# Patient Record
Sex: Male | Born: 1980 | Race: Black or African American | Hispanic: No | Marital: Single | Smoking: Current every day smoker
Health system: Southern US, Community
[De-identification: ages and names within clinical notes are randomized; demographics above are authoritative.]

## PROBLEM LIST (undated history)

## (undated) ENCOUNTER — Ambulatory Visit (HOSPITAL_COMMUNITY): Admission: EM | Payer: 59 | Source: Home / Self Care

## (undated) DIAGNOSIS — J45909 Unspecified asthma, uncomplicated: Secondary | ICD-10-CM

---
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
## 2015-06-19 ENCOUNTER — Emergency Department (HOSPITAL_COMMUNITY)
Admission: EM | Admit: 2015-06-19 | Discharge: 2015-06-19 | Disposition: A | Discharge status: Home / Self Care | Payer: Self-pay | Attending: Emergency Medicine | Admitting: Emergency Medicine

## 2015-06-19 ENCOUNTER — Encounter (HOSPITAL_COMMUNITY): Payer: Self-pay | Admitting: Emergency Medicine

## 2015-06-19 DIAGNOSIS — K029 Dental caries, unspecified: Secondary | ICD-10-CM | POA: Insufficient documentation

## 2015-06-19 DIAGNOSIS — Z720 Tobacco use: Secondary | ICD-10-CM | POA: Insufficient documentation

## 2015-06-19 DIAGNOSIS — K047 Periapical abscess without sinus: Secondary | ICD-10-CM | POA: Insufficient documentation

## 2015-06-19 MED ORDER — TRAMADOL HCL 50 MG PO TABS
50.0000 mg | ORAL_TABLET | Freq: Once | ORAL | Status: AC
  Administered 2015-06-19: 50 mg via ORAL
  Filled 2015-06-19: qty 1

## 2015-06-19 MED ORDER — TRAMADOL HCL 50 MG PO TABS: 50.0000 mg | ORAL_TABLET | Freq: Four times a day (QID) | ORAL | Status: DC | PRN

## 2015-06-19 MED ORDER — AMOXICILLIN 500 MG PO CAPS: 500.0000 mg | ORAL_CAPSULE | Freq: Three times a day (TID) | ORAL | Status: AC

## 2015-06-19 NOTE — ED Notes (Signed)
PT c/o left lower dental pain x2 weeks.

## 2015-06-19 NOTE — Discharge Instructions (Signed)
Dental Abscess °A dental abscess is a collection of infected fluid (pus) from a bacterial infection in the inner part of the tooth (pulp). It usually occurs at the end of the tooth's root.  °CAUSES  °· Severe tooth decay. °· Trauma to the tooth that allows bacteria to enter into the pulp, such as a broken or chipped tooth. °SYMPTOMS  °· Severe pain in and around the infected tooth. °· Swelling and redness around the abscessed tooth or in the mouth or face. °· Tenderness. °· Pus drainage. °· Bad breath. °· Bitter taste in the mouth. °· Difficulty swallowing. °· Difficulty opening the mouth. °· Nausea. °· Vomiting. °· Chills. °· Swollen neck glands. °DIAGNOSIS  °· A medical and dental history will be taken. °· An examination will be performed by tapping on the abscessed tooth. °· X-rays may be taken of the tooth to identify the abscess. °TREATMENT °The goal of treatment is to eliminate the infection. You may be prescribed antibiotic medicine to stop the infection from spreading. A root canal may be performed to save the tooth. If the tooth cannot be saved, it may be pulled (extracted) and the abscess may be drained.  °HOME CARE INSTRUCTIONS °· Only take over-the-counter or prescription medicines for pain, fever, or discomfort as directed by your caregiver. °· Rinse your mouth (gargle) often with salt water (¼ tsp salt in 8 oz [250 ml] of warm water) to relieve pain or swelling. °· Do not drive after taking pain medicine (narcotics). °· Do not apply heat to the outside of your face. °· Return to your dentist for further treatment as directed. °SEEK MEDICAL CARE IF: °· Your pain is not helped by medicine. °· Your pain is getting worse instead of better. °SEEK IMMEDIATE MEDICAL CARE IF: °· You have a fever or persistent symptoms for more than 2-3 days. °· You have a fever and your symptoms suddenly get worse. °· You have chills or a very bad headache. °· You have problems breathing or swallowing. °· You have trouble  opening your mouth. °· You have swelling in the neck or around the eye. °Document Released: 11/08/2005 Document Revised: 08/02/2012 Document Reviewed: 02/16/2011 °ExitCare® Patient Information ©2015 [employer]. This information is not intended to replace advice given to you by your health care provider. Make sure you discuss any questions you have with your health care provider. ° ° °Complete your entire course of antibiotics as prescribed.  You  may use the tramadol for pain relief but do not drive within 4 hours of taking as this will make you drowsy.  Avoid applying heat or ice to this abscess area which can worsen your symptoms.  You may use warm salt water swish and spit treatment or half peroxide and water swish and spit after meals to keep this area clean as discussed.  Call the dentist listed above for further management of your symptoms. ° ° °

## 2015-06-20 LAB — CBG MONITORING, ED: Glucose-Capillary: 238 mg/dL — ABNORMAL HIGH (ref 65–99)

## 2015-06-20 NOTE — ED Provider Notes (Signed)
CSN: [id number]     Arrival date & time 06/19/15  1403 History   First MD Initiated Contact with Patient 06/19/15 1426     Chief Complaint  Patient presents with  . Dental Pain     (Consider location/radiation/quality/duration/timing/severity/associated sxs/prior Treatment) Patient is a 34 y.o. male presenting with tooth pain. The history is provided by the patient.  Dental Pain Location:  Lower Lower teeth location:  17/LL 3rd molar and 18/LL 2nd molar Quality:  Shooting and throbbing Severity:  Moderate Onset quality:  Gradual Duration:  2 weeks Timing:  Constant Progression:  Worsening Chronicity:  Recurrent Context: dental caries and poor dentition   Prior workup: none. Relieved by:  Nothing Worsened by:  Cold food/drink Ineffective treatments:  Acetaminophen (bc's ) Associated symptoms: facial pain and gum swelling   Associated symptoms: no congestion, no difficulty swallowing, no facial swelling, no fever, no headaches, no neck pain, no neck swelling, no oral bleeding, no oral lesions and no trismus   Risk factors: lack of dental care and smoking     History reviewed. No pertinent past medical history. History reviewed. No pertinent past surgical history. History reviewed. No pertinent family history. History  Substance Use Topics  . Smoking status: Current Every Day Smoker -- 1.50 packs/day    Types: Cigarettes  . Smokeless tobacco: Not on file  . Alcohol Use: Yes     Comment: occassionally    Review of Systems  Constitutional: Negative for fever.  HENT: Positive for dental problem. Negative for congestion, facial swelling and mouth sores.   Musculoskeletal: Negative for neck pain.  Neurological: Negative for headaches.      Allergies  Review of patient's allergies indicates no known allergies.  Home Medications   Prior to Admission medications   Medication Sig Start Date End Date Taking? Authorizing Provider  amoxicillin (AMOXIL) 500 MG capsule Take  1 capsule (500 mg total) by mouth 3 (three) times daily. 06/19/15 06/29/15  Burgess Amor, PA-C  traMADol (ULTRAM) 50 MG tablet Take 1 tablet (50 mg total) by mouth every 6 (six) hours as needed. 06/19/15   Burgess Amor, PA-C   BP 150/88 mmHg  Pulse 78  Temp(Src) 97.6 F (36.4 C) (Oral)  Resp 18  Ht 6' (1.829 m)  Wt 175 lb (79.379 kg)  BMI 23.73 kg/m2  SpO2 100% Physical Exam  Constitutional: He is oriented to person, place, and time. He appears well-developed and well-nourished. No distress.  HENT:  Head: Normocephalic and atraumatic.  Right Ear: Tympanic membrane and external ear normal.  Left Ear: Tympanic membrane and external ear normal.  Mouth/Throat: Oropharynx is clear and moist and mucous membranes are normal. No oral lesions. No trismus in the jaw. Dental abscesses present.    Cavity and decay of left lower 2nd and 3rd molars.  Gingival edema, induration without fluctuance, no drainage, no facial erythema.  Subungual space is soft.  Eyes: Conjunctivae are normal.  Neck: Normal range of motion. Neck supple.  Cardiovascular: Normal rate and normal heart sounds.   Pulmonary/Chest: Effort normal.  Abdominal: He exhibits no distension.  Musculoskeletal: Normal range of motion.  Lymphadenopathy:    He has no cervical adenopathy.  Neurological: He is alert and oriented to person, place, and time.  Skin: Skin is warm and dry. No erythema.  Psychiatric: He has a normal mood and affect.    ED Course  Procedures (including critical care time) Labs Review Labs Reviewed - No data to display  Imaging Review No results  found.   EKG Interpretation None      MDM   Final diagnoses:  Dental abscess    Pt placed on amoxil, tramadol.  Dental referrals given. Suggested dental putty as temporary pain reliever.  Prn f/u. No trismus, no facial cellulitis or abscess, no drainable abscess noted.    Burgess Amor, PA-C 06/20/15 [id number]  Rolland Porter, MD 06/21/15 1005

## 2015-06-24 ENCOUNTER — Encounter (HOSPITAL_COMMUNITY): Payer: Self-pay

## 2015-06-24 ENCOUNTER — Emergency Department (HOSPITAL_COMMUNITY)
Admission: EM | Admit: 2015-06-24 | Discharge: 2015-06-24 | Disposition: A | Discharge status: Home / Self Care | Payer: Self-pay | Attending: Emergency Medicine | Admitting: Emergency Medicine

## 2015-06-24 DIAGNOSIS — K047 Periapical abscess without sinus: Secondary | ICD-10-CM | POA: Insufficient documentation

## 2015-06-24 DIAGNOSIS — Z720 Tobacco use: Secondary | ICD-10-CM | POA: Insufficient documentation

## 2015-06-24 DIAGNOSIS — K0381 Cracked tooth: Secondary | ICD-10-CM | POA: Insufficient documentation

## 2015-06-24 MED ORDER — HYDROCODONE-ACETAMINOPHEN 5-325 MG PO TABS: 1.0000 | ORAL_TABLET | ORAL | Status: DC | PRN

## 2015-06-24 MED ORDER — CLINDAMYCIN HCL 150 MG PO CAPS
300.0000 mg | ORAL_CAPSULE | Freq: Once | ORAL | Status: AC
  Administered 2015-06-24: 300 mg via ORAL
  Filled 2015-06-24: qty 2

## 2015-06-24 MED ORDER — OXYCODONE-ACETAMINOPHEN 5-325 MG PO TABS
1.0000 | ORAL_TABLET | Freq: Once | ORAL | Status: AC
  Administered 2015-06-24: 1 via ORAL
  Filled 2015-06-24: qty 1

## 2015-06-24 MED ORDER — CLINDAMYCIN HCL 150 MG PO CAPS: 300.0000 mg | ORAL_CAPSULE | Freq: Three times a day (TID) | ORAL | Status: DC

## 2015-06-24 NOTE — Discharge Instructions (Signed)
Continue to take the ibuprofen. Stop the tramadol and take the hydrocodone. Stop the [facility] powder. Follow up with the dentist as soon as possible.

## 2015-06-24 NOTE — ED Provider Notes (Signed)
CSN: [id number]     Arrival date & time 06/24/15  2044 History   First MD Initiated Contact with Patient 06/24/15 2058     Chief Complaint  Patient presents with  . Dental Pain     (Consider location/radiation/quality/duration/timing/severity/associated sxs/prior Treatment) Patient is a 34 y.o. male presenting with tooth pain. The history is provided by the patient.  Dental Pain Location:  Lower Lower teeth location:  18/LL 2nd molar and 19/LL 1st molar Quality:  Throbbing Severity:  Severe Onset quality:  Gradual Duration:  2 weeks Timing:  Constant Progression:  Worsening Context: abscess   Relieved by:  Nothing Worsened by:  Cold food/drink  Randy Silva is a 33 y.o. male who presents to the ED with dental pain. He was evaluated here 7/28 at that time he thought the pain was mostly in the second and third molars but now it feels more like first and second. There is a crack in the first molar on the left. He was treated with antibiotics, NSAIDS and Tramadol. He states that he has been taking the medication but the pain is the same. He has been brushing his teeth with a soft brush and using salt water rinses, peroxide and any OTC pain reliever he can find. He states that he can not see the dentist for 2 weeks. The pain is now radiating to the left ear. He has BC powder on the teeth.    History reviewed. No pertinent past medical history. History reviewed. No pertinent past surgical history. History reviewed. No pertinent family history. History  Substance Use Topics  . Smoking status: Current Every Day Smoker -- 1.50 packs/day    Types: Cigarettes  . Smokeless tobacco: Not on file  . Alcohol Use: Yes     Comment: occassionally    Review of Systems Negative except as stated in HPI   Allergies  Review of patient's allergies indicates no known allergies.  Home Medications   Prior to Admission medications   Medication Sig Start Date End Date Taking? Authorizing Provider   amoxicillin (AMOXIL) 500 MG capsule Take 1 capsule (500 mg total) by mouth 3 (three) times daily. 06/19/15 06/29/15  Burgess Amor, PA-C  clindamycin (CLEOCIN) 150 MG capsule Take 2 capsules (300 mg total) by mouth 3 (three) times daily. 06/24/15   Rhesa Forsberg Orlene Och, NP  HYDROcodone-acetaminophen (NORCO/VICODIN) 5-325 MG per tablet Take 1 tablet by mouth every 4 (four) hours as needed. 06/24/15   Jodey Burbano Orlene Och, NP   BP 157/94 mmHg  Pulse 60  Temp(Src) 98.2 F (36.8 C) (Oral)  Resp 16  Ht 6' (1.829 m)  Wt 175 lb (79.379 kg)  BMI 23.73 kg/m2  SpO2 100% Physical Exam  Constitutional: He is oriented to person, place, and time. He appears well-developed and well-nourished. No distress.  Patient appears very uncomfortable  HENT:  Head: Normocephalic.  Mouth/Throat: Uvula is midline and oropharynx is clear and moist.    Decay of left lower first and second molars. Swelling of the gum surrounding the teeth and tenderness on exam. There is a crack in the first molar.  No facial edema or erythema.  There is BC powder on the left lower molars.  Eyes: EOM are normal.  Neck: Normal range of motion. Neck supple.  Cardiovascular: Normal rate.   Pulmonary/Chest: Effort normal.  Musculoskeletal: Normal range of motion.  Lymphadenopathy:    He has no cervical adenopathy.  Neurological: He is alert and oriented to person, place, and time. No cranial  nerve deficit.  Skin: Skin is warm and dry.  Psychiatric: He has a normal mood and affect. His behavior is normal.  Nursing note and vitals reviewed.   ED Course  Procedures (including critical care time) Labs Review Labs Reviewed - No data to display  Imaging Review No results found.   EKG Interpretation None      MDM  34 y.o. male with dental pain, dental infection and broken tooth. Stable for d/c without fever and does not appear toxic. Will give hydrocodone for pain and he will continue ibuprofen.  He will follow up with the dentist as planned.    Final diagnoses:  Dental infection       Janne Napoleon, NP 06/24/15 2125  Eber Hong, MD 06/25/15 [phone]

## 2015-06-24 NOTE — ED Notes (Signed)
Pt seen here recently for dental pain given NSAIDs and antibiotic states no pain relief. States he has an abscess.

## 2015-07-11 ENCOUNTER — Emergency Department (HOSPITAL_COMMUNITY)
Admission: EM | Admit: 2015-07-11 | Discharge: 2015-07-11 | Disposition: A | Discharge status: Home / Self Care | Payer: Self-pay | Attending: Emergency Medicine | Admitting: Emergency Medicine

## 2015-07-11 ENCOUNTER — Encounter (HOSPITAL_COMMUNITY): Payer: Self-pay | Admitting: Emergency Medicine

## 2015-07-11 DIAGNOSIS — K0889 Other specified disorders of teeth and supporting structures: Secondary | ICD-10-CM

## 2015-07-11 DIAGNOSIS — Z720 Tobacco use: Secondary | ICD-10-CM | POA: Insufficient documentation

## 2015-07-11 DIAGNOSIS — K029 Dental caries, unspecified: Secondary | ICD-10-CM | POA: Insufficient documentation

## 2015-07-11 DIAGNOSIS — K088 Other specified disorders of teeth and supporting structures: Secondary | ICD-10-CM | POA: Insufficient documentation

## 2015-07-11 DIAGNOSIS — K0381 Cracked tooth: Secondary | ICD-10-CM | POA: Insufficient documentation

## 2015-07-11 MED ORDER — HYDROCODONE-ACETAMINOPHEN 5-325 MG PO TABS: 1.0000 | ORAL_TABLET | ORAL | Status: DC | PRN

## 2015-07-11 MED ORDER — AMOXICILLIN 500 MG PO CAPS: 500.0000 mg | ORAL_CAPSULE | Freq: Three times a day (TID) | ORAL | Status: AC

## 2015-07-11 NOTE — Discharge Instructions (Signed)
Dental Caries °Dental caries (also called tooth decay) is the most common oral disease. It can occur at any age but is more common in children and young adults.  °HOW DENTAL CARIES DEVELOPS  °The process of decay begins when bacteria and foods (particularly sugars and starches) combine in your mouth to produce plaque. Plaque is a substance that sticks to the hard, outer surface of a tooth (enamel). The bacteria in plaque produce acids that attack enamel. These acids may also attack the root surface of a tooth (cementum) if it is exposed. Repeated attacks dissolve these surfaces and create holes in the tooth (cavities). If left untreated, the acids destroy the other layers of the tooth.  °RISK FACTORS °· Frequent sipping of sugary beverages.   °· Frequent snacking on sugary and starchy foods, especially those that easily get stuck in the teeth.   °· Poor oral hygiene.   °· Dry mouth.   °· Substance abuse such as methamphetamine abuse.   °· Broken or poor-fitting dental restorations.   °· Eating disorders.   °· Gastroesophageal reflux disease (GERD).   °· Certain radiation treatments to the head and neck. °SYMPTOMS °In the early stages of dental caries, symptoms are seldom present. Sometimes white, chalky areas may be seen on the enamel or other tooth layers. In later stages, symptoms may include: °· Pits and holes on the enamel. °· Toothache after sweet, hot, or cold foods or drinks are consumed. °· Pain around the tooth. °· Swelling around the tooth. °DIAGNOSIS  °Most of the time, dental caries is detected during a regular dental checkup. A diagnosis is made after a thorough medical and dental history is taken and the surfaces of your teeth are checked for signs of dental caries. Sometimes special instruments, such as lasers, are used to check for dental caries. Dental X-ray exams may be taken so that areas not visible to the eye (such as between the contact areas of the teeth) can be checked for cavities.    °TREATMENT  °If dental caries is in its early stages, it may be reversed with a fluoride treatment or an application of a remineralizing agent at the dental office. Thorough brushing and flossing at home is needed to aid these treatments. If it is in its later stages, treatment depends on the location and extent of tooth destruction:  °· If a small area of the tooth has been destroyed, the destroyed area will be removed and cavities will be filled with a material such as gold, silver amalgam, or composite resin.   °· If a large area of the tooth has been destroyed, the destroyed area will be removed and a cap (crown) will be fitted over the remaining tooth structure.   °· If the center part of the tooth (pulp) is affected, a procedure called a root canal will be needed before a filling or crown can be placed.   °· If most of the tooth has been destroyed, the tooth may need to be pulled (extracted). °HOME CARE INSTRUCTIONS °You can prevent, stop, or reverse dental caries at home by practicing good oral hygiene. Good oral hygiene includes: °· Thoroughly cleaning your teeth at least twice a day with a toothbrush and dental floss.   °· Using a fluoride toothpaste. A fluoride mouth rinse may also be used if recommended by your dentist or health care provider.   °· Restricting the amount of sugary and starchy foods and sugary liquids you consume.   °· Avoiding frequent snacking on these foods and sipping of these liquids.   °· Keeping regular visits with   a dentist for checkups and cleanings. °PREVENTION  °· Practice good oral hygiene. °· Consider a dental sealant. A dental sealant is a coating material that is applied by your dentist to the pits and grooves of teeth. The sealant prevents food from being trapped in them. It may protect the teeth for several years. °· Ask about fluoride supplements if you live in a community without fluorinated water or with water that has a low fluoride content. Use fluoride supplements  as directed by your dentist or health care provider. °· Allow fluoride varnish applications to teeth if directed by your dentist or health care provider. °Document Released: 07/31/2002 Document Revised: 03/25/2014 Document Reviewed: 11/10/2012 °ExitCare® Patient Information ©2015 [employer]. This information is not intended to replace advice given to you by your health care provider. Make sure you discuss any questions you have with your health care provider. ° °Dental Pain °A tooth ache may be caused by cavities (tooth decay). Cavities expose the nerve of the tooth to air and hot or cold temperatures. It may come from an infection or abscess (also called a boil or furuncle) around your tooth. It is also often caused by dental caries (tooth decay). This causes the pain you are having. °DIAGNOSIS  °Your caregiver can diagnose this problem by exam. °TREATMENT  °· If caused by an infection, it may be treated with medications which kill germs (antibiotics) and pain medications as prescribed by your caregiver. Take medications as directed. °· Only take over-the-counter or prescription medicines for pain, discomfort, or fever as directed by your caregiver. °· Whether the tooth ache today is caused by infection or dental disease, you should see your dentist as soon as possible for further care. °SEEK MEDICAL CARE IF: °The exam and treatment you received today has been provided on an emergency basis only. This is not a substitute for complete medical or dental care. If your problem worsens or new problems (symptoms) appear, and you are unable to meet with your dentist, call or return to this location. °SEEK IMMEDIATE MEDICAL CARE IF:  °· You have a fever. °· You develop redness and swelling of your face, jaw, or neck. °· You are unable to open your mouth. °· You have severe pain uncontrolled by pain medicine. °MAKE SURE YOU:  °· Understand these instructions. °· Will watch your condition. °· Will get help right away if  you are not doing well or get worse. °Document Released: 11/08/2005 Document Revised: 01/31/2012 Document Reviewed: 06/26/2008 °ExitCare® Patient Information ©2015 [employer]. This information is not intended to replace advice given to you by your health care provider. Make sure you discuss any questions you have with your health care provider. ° °

## 2015-07-11 NOTE — ED Notes (Signed)
Patient complaining of lower left dental pain x 1 1/2 months. States he has been unable to follow up with dentist due to financial reasons.

## 2015-07-12 NOTE — ED Provider Notes (Signed)
CSN: [id number]     Arrival date & time 07/11/15  1612 History   First MD Initiated Contact with Patient 07/11/15 1634     Chief Complaint  Patient presents with  . Dental Pain     (Consider location/radiation/quality/duration/timing/severity/associated sxs/prior Treatment) Patient is a 34 y.o. male presenting with tooth pain. The history is provided by the patient.  Dental Pain Location:  Lower Lower teeth location:  17/LL 3rd molar, 18/LL 2nd molar and 19/LL 1st molar Quality:  Aching and throbbing Severity:  Severe Onset quality:  Gradual Timing:  Intermittent Progression:  Worsening Chronicity:  New Context: dental caries and poor dentition   Context comment:  Also reports new fracture in first left lower molar Relieved by:  Nothing Worsened by:  Cold food/drink and pressure Ineffective treatments:  Topical anesthetic gel (bc powders) Associated symptoms: facial pain and gum swelling   Associated symptoms: no difficulty swallowing, no facial swelling, no fever, no neck pain, no neck swelling and no trismus     History reviewed. No pertinent past medical history. History reviewed. No pertinent past surgical history. History reviewed. No pertinent family history. Social History  Substance Use Topics  . Smoking status: Current Every Day Smoker -- 1.50 packs/day    Types: Cigarettes  . Smokeless tobacco: None  . Alcohol Use: Yes     Comment: occassionally    Review of Systems  Constitutional: Negative for fever.  HENT: Positive for dental problem. Negative for facial swelling and sore throat.   Respiratory: Negative for shortness of breath.   Musculoskeletal: Negative for neck pain and neck stiffness.      Allergies  Review of patient's allergies indicates no known allergies.  Home Medications   Prior to Admission medications   Medication Sig Start Date End Date Taking? Authorizing Provider  acetaminophen (TYLENOL) 500 MG tablet Take 500 mg by mouth every 6  (six) hours as needed for mild pain or moderate pain.   Yes Historical Provider, MD  ibuprofen (ADVIL,MOTRIN) 200 MG tablet Take 200 mg by mouth every 6 (six) hours as needed for mild pain or moderate pain.   Yes Historical Provider, MD  amoxicillin (AMOXIL) 500 MG capsule Take 1 capsule (500 mg total) by mouth 3 (three) times daily. 07/11/15 07/21/15  Burgess Amor, PA-C  clindamycin (CLEOCIN) 150 MG capsule Take 2 capsules (300 mg total) by mouth 3 (three) times daily. Patient not taking: Reported on 07/11/2015 06/24/15   Janne Napoleon, NP  HYDROcodone-acetaminophen (NORCO/VICODIN) 5-325 MG per tablet Take 1 tablet by mouth every 4 (four) hours as needed. 07/11/15   Burgess Amor, PA-C   BP 135/76 mmHg  Pulse 68  Temp(Src) 98 F (36.7 C) (Oral)  Resp 18  Ht 6' (1.829 m)  Wt 175 lb (79.379 kg)  BMI 23.73 kg/m2  SpO2 98% Physical Exam  Constitutional: He is oriented to person, place, and time. He appears well-developed and well-nourished. No distress.  HENT:  Head: Normocephalic and atraumatic.  Right Ear: Tympanic membrane and external ear normal.  Left Ear: Tympanic membrane and external ear normal.  Mouth/Throat: Oropharynx is clear and moist and mucous membranes are normal. No oral lesions. No trismus in the jaw.    Decay noted to left lower 2nd and 3rd molar teeth, 1st molar also with fracture/decay along posterior edge.  Gingival erythema without edema.  Eyes: Conjunctivae are normal.  Neck: Normal range of motion. Neck supple.  Cardiovascular: Normal rate and normal heart sounds.   Pulmonary/Chest: Effort normal.  Abdominal: He exhibits no distension.  Musculoskeletal: Normal range of motion.  Lymphadenopathy:    He has no cervical adenopathy.  Neurological: He is alert and oriented to person, place, and time.  Skin: Skin is warm and dry. No erythema.  Psychiatric: He has a normal mood and affect.    ED Course  Procedures (including critical care time) Labs Review Labs Reviewed -  No data to display  Imaging Review No results found. I have personally reviewed and evaluated these images and lab results as part of my medical decision-making.   EKG Interpretation None      MDM   Final diagnoses:  Dental cavities  Pain, dental    No abscess at this time but symptoms of probable chronic infection. He was placed on hydrocodone, amoxil prescribed.  He was advised that this will not improve until he is treated by a dentist and referrals were given for f/u care.  Pt states he can't afford to see a dentist, advised pt it is more expensive to come here than to see a dentist.  Referral to the health dept's dental program, also given [facility] which offers limited pro bono services weekly. Pt to call for appt.    Burgess Amor, PA-C 07/12/15 1420  Bethann Berkshire, MD 07/12/15 [phone]

## 2015-08-03 ENCOUNTER — Encounter (HOSPITAL_COMMUNITY): Payer: Self-pay | Admitting: Emergency Medicine

## 2015-08-03 ENCOUNTER — Emergency Department (HOSPITAL_COMMUNITY)
Admission: EM | Admit: 2015-08-03 | Discharge: 2015-08-03 | Disposition: A | Discharge status: Home / Self Care | Payer: Self-pay | Attending: Emergency Medicine | Admitting: Emergency Medicine

## 2015-08-03 DIAGNOSIS — K088 Other specified disorders of teeth and supporting structures: Secondary | ICD-10-CM | POA: Insufficient documentation

## 2015-08-03 DIAGNOSIS — K0889 Other specified disorders of teeth and supporting structures: Secondary | ICD-10-CM

## 2015-08-03 DIAGNOSIS — Z720 Tobacco use: Secondary | ICD-10-CM | POA: Insufficient documentation

## 2015-08-03 MED ORDER — DICLOFENAC SODIUM 75 MG PO TBEC: 75.0000 mg | DELAYED_RELEASE_TABLET | Freq: Two times a day (BID) | ORAL | Status: DC

## 2015-08-03 MED ORDER — AMOXICILLIN 500 MG PO CAPS: 500.0000 mg | ORAL_CAPSULE | Freq: Three times a day (TID) | ORAL | Status: DC

## 2015-08-03 NOTE — ED Provider Notes (Signed)
CSN: [id number]     Arrival date & time 08/03/15  1449 History  This chart was scribed for non-physician practitioner, Lonia Skinner. Joylene Grapes, working with Raeford Razor, MD by Marica Otter, ED Scribe. This patient was seen in room [facility] and the patient's care was started at 3:41 PM.   Chief Complaint  Patient presents with  . Dental Pain   The history is provided by the patient. No language interpreter was used.   PCP: No PCP Per Patient HPI Comments: Randy Silva is a 34 y.o. male who presents to the Emergency Department complaining of ongoing, gradual onset, intermittent,  left sided, 8/10 lower dental pain radiating to the top of the face onset 1.5 months ago. Pt denies difficulty swallowing, any chronic health conditions, daily meds, or medicinal allergies. Pt reports he has been unable to f/u with a dentist due to financial reasons.   History reviewed. No pertinent past medical history. History reviewed. No pertinent past surgical history. History reviewed. No pertinent family history. Social History  Substance Use Topics  . Smoking status: Current Every Day Smoker -- 1.50 packs/day    Types: Cigarettes  . Smokeless tobacco: None  . Alcohol Use: Yes     Comment: occassionally    Review of Systems  Constitutional: Negative for fever and chills.  HENT: Positive for dental problem. Negative for trouble swallowing.        Facial pain resulting from dental pain   All other systems reviewed and are negative.  Allergies  Review of patient's allergies indicates no known allergies.  Home Medications   Prior to Admission medications   Medication Sig Start Date End Date Taking? Authorizing Provider  acetaminophen (TYLENOL) 500 MG tablet Take 500 mg by mouth every 6 (six) hours as needed for mild pain or moderate pain.    Historical Provider, MD  clindamycin (CLEOCIN) 150 MG capsule Take 2 capsules (300 mg total) by mouth 3 (three) times daily. Patient not taking:  Reported on 07/11/2015 06/24/15   Janne Napoleon, NP  HYDROcodone-acetaminophen (NORCO/VICODIN) 5-325 MG per tablet Take 1 tablet by mouth every 4 (four) hours as needed. 07/11/15   Burgess Amor, PA-C  ibuprofen (ADVIL,MOTRIN) 200 MG tablet Take 200 mg by mouth every 6 (six) hours as needed for mild pain or moderate pain.    Historical Provider, MD   Triage Vitals: BP 154/79 mmHg  Pulse 69  Temp(Src) 98.3 F (36.8 C) (Oral)  Resp 18  Ht 6' (1.829 m)  Wt 175 lb (79.379 kg)  BMI 23.73 kg/m2  SpO2 99% Physical Exam  Constitutional: He is oriented to person, place, and time. He appears well-developed and well-nourished.  HENT:  Head: Normocephalic.  Mouth/Throat: Uvula is midline, oropharynx is clear and moist and mucous membranes are normal.  Swelling to lower gumline, cavity to left, lower, third molar   Eyes: EOM are normal.  Neck: Normal range of motion.  Cardiovascular: Normal rate, regular rhythm and normal heart sounds.   Pulmonary/Chest: Effort normal.  Abdominal: He exhibits no distension.  Musculoskeletal: Normal range of motion.  Neurological: He is alert and oriented to person, place, and time.  Psychiatric: He has a normal mood and affect.  Nursing note and vitals reviewed.   ED Course  Procedures (including critical care time) DIAGNOSTIC STUDIES: Oxygen Saturation is 99% on RA, nl by my interpretation.    COORDINATION OF CARE: 3:43 PM: Discussed treatment plan which includes [facility], pain meds, and antibiotics with pt  at bedside; patient verbalizes understanding and agrees with treatment plan.  Labs Review Labs Reviewed - No data to display  Imaging Review No results found. I have personally reviewed and evaluated these images and lab results as part of my medical decision-making.   EKG Interpretation None      MDM   Final diagnoses:  Toothache    amoxicillian  30 voltaren   Lonia Skinner [place], PA-C 08/03/15 1553  Raeford Razor,  MD 08/14/15 [phone]

## 2015-08-03 NOTE — Discharge Instructions (Signed)

## 2015-08-03 NOTE — ED Notes (Signed)
Pt reports left sided dental pain x1.5 months. nad noted.

## 2017-04-09 ENCOUNTER — Emergency Department (HOSPITAL_COMMUNITY)
Admission: EM | Admit: 2017-04-09 | Discharge: 2017-04-09 | Disposition: A | Discharge status: Home / Self Care | Payer: Self-pay | Attending: Emergency Medicine | Admitting: Emergency Medicine

## 2017-04-09 ENCOUNTER — Emergency Department (HOSPITAL_COMMUNITY): Payer: Self-pay

## 2017-04-09 ENCOUNTER — Encounter (HOSPITAL_COMMUNITY): Payer: Self-pay | Admitting: *Deleted

## 2017-04-09 DIAGNOSIS — M79641 Pain in right hand: Secondary | ICD-10-CM | POA: Insufficient documentation

## 2017-04-09 DIAGNOSIS — Y999 Unspecified external cause status: Secondary | ICD-10-CM | POA: Insufficient documentation

## 2017-04-09 DIAGNOSIS — F17210 Nicotine dependence, cigarettes, uncomplicated: Secondary | ICD-10-CM | POA: Insufficient documentation

## 2017-04-09 DIAGNOSIS — Y929 Unspecified place or not applicable: Secondary | ICD-10-CM | POA: Insufficient documentation

## 2017-04-09 DIAGNOSIS — Y939 Activity, unspecified: Secondary | ICD-10-CM | POA: Insufficient documentation

## 2017-04-09 MED ORDER — HYDROCODONE-ACETAMINOPHEN 5-325 MG PO TABS: 1.0000 | ORAL_TABLET | Freq: Four times a day (QID) | ORAL | 0 refills | Status: DC | PRN
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
## 2017-04-09 NOTE — Discharge Instructions (Signed)
Alternate ibuprofen and Tylenol every 3 hours as needed for pain. He may take Norco for pain as needed, but do not operate heavy machinery, drink alcohol, drive if this medication makes you drowsy. Wear the splint as directed. Follow up with the orthopedic doctor for further evaluation of your hand and to check to see if there is additional fracture. Return to the ED if any concerning symptoms develop.
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
## 2020-03-26 ENCOUNTER — Encounter (HOSPITAL_COMMUNITY): Payer: Self-pay | Admitting: Emergency Medicine

## 2020-03-26 ENCOUNTER — Ambulatory Visit (HOSPITAL_COMMUNITY)
Admission: EM | Admit: 2020-03-26 | Discharge: 2020-03-26 | Disposition: A | Discharge status: Home / Self Care | Payer: Medicaid Other | Attending: Family Medicine | Admitting: Family Medicine

## 2020-03-26 ENCOUNTER — Other Ambulatory Visit: Payer: Self-pay

## 2020-03-26 DIAGNOSIS — J4530 Mild persistent asthma, uncomplicated: Secondary | ICD-10-CM | POA: Diagnosis present

## 2020-03-26 DIAGNOSIS — R05 Cough: Secondary | ICD-10-CM

## 2020-03-26 DIAGNOSIS — Z20822 Contact with and (suspected) exposure to covid-19: Secondary | ICD-10-CM | POA: Diagnosis present

## 2020-03-26 DIAGNOSIS — R059 Cough, unspecified: Secondary | ICD-10-CM

## 2020-03-26 MED ORDER — ALBUTEROL SULFATE HFA 108 (90 BASE) MCG/ACT IN AERS: 1.0000 | INHALATION_SPRAY | Freq: Four times a day (QID) | RESPIRATORY_TRACT | 0 refills | Status: AC | PRN

## 2020-03-26 MED ORDER — BENZONATATE 100 MG PO CAPS: 100.0000 mg | ORAL_CAPSULE | Freq: Three times a day (TID) | ORAL | 0 refills | Status: AC | PRN

## 2020-03-26 MED ORDER — PROMETHAZINE-DM 6.25-15 MG/5ML PO SYRP: 5.0000 mL | ORAL_SOLUTION | Freq: Every evening | ORAL | 0 refills | Status: DC | PRN

## 2020-03-26 NOTE — ED Provider Notes (Signed)
  [facility]   MRN: [id number] DOB: Aug 15, 1981  Subjective:   Randy Silva is a 39 y.o. male presenting for 1 week history of persistent cough and malaise.  Patient has a history of asthma but is out of his albuterol inhaler.  His girlfriend did test positive for COVID-19 last week.  Patient is also a smoker, has 1 PPD.  Uses albuterol inhaler.  No Known Allergies  PMH of asthma.   History reviewed. No pertinent surgical history.  No family history on file.  Social History   Tobacco Use  . Smoking status: Current Every Day Smoker    Packs/day: 1.00    Types: Cigarettes  . Smokeless tobacco: Never Used  Substance Use Topics  . Alcohol use: No  . Drug use: Yes    Types: Marijuana    ROS   Objective:   Vitals: BP (!) 156/86   Pulse 79   Temp 99.2 F (37.3 C) (Oral)   Resp 16   SpO2 97%   Physical Exam Constitutional:      General: He is not in acute distress.    Appearance: Normal appearance. He is well-developed. He is not ill-appearing, toxic-appearing or diaphoretic.  HENT:     Head: Normocephalic and atraumatic.     Right Ear: External ear normal.     Left Ear: External ear normal.     Nose: Nose normal.     Mouth/Throat:     Mouth: Mucous membranes are moist.     Pharynx: Oropharynx is clear.  Eyes:     General: No scleral icterus.    Extraocular Movements: Extraocular movements intact.     Pupils: Pupils are equal, round, and reactive to light.  Cardiovascular:     Rate and Rhythm: Normal rate and regular rhythm.     Heart sounds: Normal heart sounds. No murmur. No friction rub. No gallop.   Pulmonary:     Effort: Pulmonary effort is normal. No respiratory distress.     Breath sounds: Normal breath sounds. No stridor. No wheezing, rhonchi or rales.  Skin:    General: Skin is warm and dry.  Neurological:     Mental Status: He is alert and oriented to person, place, and time.  Psychiatric:        Mood and Affect: Mood normal.         Behavior: Behavior normal.        Thought Content: Thought content normal.        Judgment: Judgment normal.     Assessment and Plan :   PDMP not reviewed this encounter.  1. Cough   2. Mild persistent asthma without complication   3. Exposure to COVID-19 virus     Will manage for viral illness such as viral URI, viral syndrome, viral rhinitis, COVID-19. Counseled patient on nature of COVID-19 including modes of transmission, diagnostic testing, management and supportive care.  Offered symptomatic relief. COVID 19 testing is pending. Counseled patient on potential for adverse effects with medications prescribed/recommended today, ER and return-to-clinic precautions discussed, patient verbalized understanding.     Wallis Bamberg, PA-C 03/28/20 1148

## 2020-03-26 NOTE — Discharge Instructions (Addendum)
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
## 2020-03-27 LAB — SARS CORONAVIRUS 2 (TAT 6-24 HRS): SARS Coronavirus 2: NEGATIVE

## 2020-04-10 ENCOUNTER — Ambulatory Visit (HOSPITAL_COMMUNITY)
Admission: EM | Admit: 2020-04-10 | Discharge: 2020-04-10 | Disposition: A | Discharge status: Home / Self Care | Payer: Medicaid Other | Attending: Family Medicine | Admitting: Family Medicine

## 2020-04-10 ENCOUNTER — Encounter (HOSPITAL_COMMUNITY): Payer: Self-pay

## 2020-04-10 DIAGNOSIS — Z79899 Other long term (current) drug therapy: Secondary | ICD-10-CM | POA: Insufficient documentation

## 2020-04-10 DIAGNOSIS — Z791 Long term (current) use of non-steroidal anti-inflammatories (NSAID): Secondary | ICD-10-CM | POA: Diagnosis not present

## 2020-04-10 DIAGNOSIS — J45909 Unspecified asthma, uncomplicated: Secondary | ICD-10-CM | POA: Diagnosis not present

## 2020-04-10 DIAGNOSIS — F17210 Nicotine dependence, cigarettes, uncomplicated: Secondary | ICD-10-CM | POA: Insufficient documentation

## 2020-04-10 DIAGNOSIS — Z20822 Contact with and (suspected) exposure to covid-19: Secondary | ICD-10-CM | POA: Insufficient documentation

## 2020-04-10 DIAGNOSIS — R05 Cough: Secondary | ICD-10-CM | POA: Diagnosis not present

## 2020-04-10 DIAGNOSIS — R059 Cough, unspecified: Secondary | ICD-10-CM

## 2020-04-10 HISTORY — DX: Unspecified asthma, uncomplicated: J45.909

## 2020-04-10 MED ORDER — ALBUTEROL SULFATE HFA 108 (90 BASE) MCG/ACT IN AERS
2.0000 | INHALATION_SPRAY | Freq: Once | RESPIRATORY_TRACT | Status: AC
  Administered 2020-04-10: 2 via RESPIRATORY_TRACT

## 2020-04-10 MED ORDER — CETIRIZINE HCL 10 MG PO TABS: 10.0000 mg | ORAL_TABLET | Freq: Every day | ORAL | 0 refills | Status: AC

## 2020-04-10 NOTE — ED Provider Notes (Signed)
[place]    CSN: [id number] Arrival date & time: 04/10/20  5573      History   Chief Complaint Chief Complaint  Patient presents with  . Cough    HPI Randy Silva is a 39 y.o. male.   Patient is a 39 year old male with past medical history of asthma.  He presents today with approximately 4 days of productive cough, mild shortness of breath, clear/yellow mucus.  Symptoms have been constant.  He is not taking any medications for symptoms.  Reporting he may have been exposed to Covid.  No nasal congestion, rhinorrhea, loss of taste or smell, fevers, chills, bodies or night sweats.  Was seen here few weeks ago and prescribed albuterol inhaler but cannot afford the medication.  ROS per HPI      Past Medical History:  Diagnosis Date  . Asthma     There are no problems to display for this patient.   History reviewed. No pertinent surgical history.     Home Medications    Prior to Admission medications   Medication Sig Start Date End Date Taking? Authorizing Provider  acetaminophen (TYLENOL) 500 MG tablet Take 500 mg by mouth every 6 (six) hours as needed for mild pain or moderate pain.    [provider]  albuterol (VENTOLIN HFA) 108 (90 Base) MCG/ACT inhaler Inhale 1-2 puffs into the lungs every 6 (six) hours as needed for wheezing or shortness of breath. 03/26/20   Jaynee Eagles, PA-C  benzonatate (TESSALON) 100 MG capsule Take 1-2 capsules (100-200 mg total) by mouth 3 (three) times daily as needed. 03/26/20   Jaynee Eagles, PA-C  cetirizine (ZYRTEC) 10 MG tablet Take 1 tablet (10 mg total) by mouth daily. 04/10/20   Loura Halt A, NP  ibuprofen (ADVIL,MOTRIN) 200 MG tablet Take 200 mg by mouth every 6 (six) hours as needed for mild pain or moderate pain.    [provider]  promethazine-dextromethorphan (PROMETHAZINE-DM) 6.25-15 MG/5ML syrup Take 5 mLs by mouth at bedtime as needed for cough. 03/26/20   Jaynee Eagles, PA-C    Family  History Family History  Problem Relation Age of Onset  . Cancer Mother   . Cancer Father     Social History Social History   Tobacco Use  . Smoking status: Current Every Day Smoker    Packs/day: 1.00    Types: Cigarettes  . Smokeless tobacco: Never Used  Substance Use Topics  . Alcohol use: No  . Drug use: Yes    Types: Marijuana     Allergies   Patient has no known allergies.   Review of Systems Review of Systems   Physical Exam Triage Vital Signs ED Triage Vitals  Enc Vitals Group     BP 04/10/20 0840 132/88     Pulse Rate 04/10/20 0840 72     Resp 04/10/20 0840 16     Temp 04/10/20 0840 97.7 F (36.5 C)     Temp Source 04/10/20 0840 Oral     SpO2 04/10/20 0840 100 %     Weight 04/10/20 0841 192 lb (87.1 kg)     Height 04/10/20 0841 6' (1.829 m)     Head Circumference --      Peak Flow --      Pain Score 04/10/20 0841 0     Pain Loc --      Pain Edu? --      Excl. in [place]? --    No data found.  Updated  Vital Signs BP 132/88   Pulse 72   Temp 97.7 F (36.5 C) (Oral)   Resp 16   Ht 6' (1.829 m)   Wt 192 lb (87.1 kg)   SpO2 100%   BMI 26.04 kg/m   Visual Acuity Right Eye Distance:   Left Eye Distance:   Bilateral Distance:    Right Eye Near:   Left Eye Near:    Bilateral Near:     Physical Exam Vitals and nursing note reviewed.  Constitutional:      General: He is not in acute distress.    Appearance: Normal appearance. He is not ill-appearing, toxic-appearing or diaphoretic.  HENT:     Head: Normocephalic and atraumatic.     Right Ear: Tympanic membrane and ear canal normal.     Left Ear: Tympanic membrane and ear canal normal.     Nose: Nose normal.     Mouth/Throat:     Pharynx: Oropharynx is clear.  Eyes:     Conjunctiva/sclera: Conjunctivae normal.  Cardiovascular:     Rate and Rhythm: Normal rate and regular rhythm.  Pulmonary:     Effort: Pulmonary effort is normal.     Breath sounds: Normal breath sounds.   Musculoskeletal:        General: Normal range of motion.     Cervical back: Normal range of motion.  Skin:    General: Skin is warm and dry.  Neurological:     Mental Status: He is alert.  Psychiatric:        Mood and Affect: Mood normal.      UC Treatments / Results  Labs (all labs ordered are listed, but only abnormal results are displayed) Labs Reviewed  SARS CORONAVIRUS 2 (TAT 6-24 HRS)    EKG   Radiology No results found.  Procedures Procedures (including critical care time)  Medications Ordered in UC Medications  albuterol (VENTOLIN HFA) 108 (90 Base) MCG/ACT inhaler 2 puff (2 puffs Inhalation Provided for home use 04/10/20 0914)    Initial Impression / Assessment and Plan / UC Course  I have reviewed the triage vital signs and the nursing notes.  Pertinent labs & imaging results that were available during my care of the patient were reviewed by me and considered in my medical decision making (see chart for details).     Cough Most likely viral or allergy related.  Lungs clear on exam. Will give albuterol inhaler here to use as needed for cough, wheezing or shortness of breath. Zyrtec daily for allergies. He can take the [facility] for cough that were previously prescribed as needed Follow up as needed for continued or worsening symptoms  Final Clinical Impressions(s) / UC Diagnoses   Final diagnoses:  Cough     Discharge Instructions     Albuterol inhaler as needed for cough, wheezing or shortness of breath. Zyrtec daily for allergies Follow up as needed for continued or worsening symptoms     ED Prescriptions    Medication Sig Dispense Auth. Provider   cetirizine (ZYRTEC) 10 MG tablet Take 1 tablet (10 mg total) by mouth daily. 30 tablet Dahlia Byes A, NP     PDMP not reviewed this encounter.   Janace Aris, NP 04/10/20 [phone]
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
## 2020-04-10 NOTE — Discharge Instructions (Addendum)
Albuterol inhaler as needed for cough, wheezing or shortness of breath. Zyrtec daily for allergies Follow up as needed for continued or worsening symptoms

## 2020-04-11 LAB — SARS CORONAVIRUS 2 (TAT 6-24 HRS): SARS Coronavirus 2: NEGATIVE

## 2023-07-21 ENCOUNTER — Ambulatory Visit (HOSPITAL_COMMUNITY)
Admission: EM | Admit: 2023-07-21 | Discharge: 2023-07-21 | Disposition: A | Discharge status: Home / Self Care | Payer: 59 | Attending: Family Medicine | Admitting: Family Medicine

## 2023-07-21 ENCOUNTER — Encounter (HOSPITAL_COMMUNITY): Payer: Self-pay

## 2023-07-21 DIAGNOSIS — Z1152 Encounter for screening for COVID-19: Secondary | ICD-10-CM | POA: Insufficient documentation

## 2023-07-21 DIAGNOSIS — J22 Unspecified acute lower respiratory infection: Secondary | ICD-10-CM | POA: Insufficient documentation

## 2023-07-21 LAB — SARS CORONAVIRUS 2 (TAT 6-24 HRS): SARS Coronavirus 2: POSITIVE — AB

## 2023-07-21 MED ORDER — IPRATROPIUM-ALBUTEROL 0.5-2.5 (3) MG/3ML IN SOLN
RESPIRATORY_TRACT | Status: AC
  Filled 2023-07-21: qty 3

## 2023-07-21 MED ORDER — ACETAMINOPHEN 325 MG PO TABS
650.0000 mg | ORAL_TABLET | Freq: Once | ORAL | Status: AC
  Administered 2023-07-21: 650 mg via ORAL

## 2023-07-21 MED ORDER — AZITHROMYCIN 250 MG PO TABS: ORAL_TABLET | ORAL | 0 refills | Status: AC

## 2023-07-21 MED ORDER — ALBUTEROL SULFATE HFA 108 (90 BASE) MCG/ACT IN AERS
2.0000 | INHALATION_SPRAY | Freq: Once | RESPIRATORY_TRACT | Status: AC
  Administered 2023-07-21: 2 via RESPIRATORY_TRACT

## 2023-07-21 MED ORDER — PREDNISONE 20 MG PO TABS: 40.0000 mg | ORAL_TABLET | Freq: Every day | ORAL | 0 refills | Status: AC

## 2023-07-21 MED ORDER — ALBUTEROL SULFATE HFA 108 (90 BASE) MCG/ACT IN AERS
INHALATION_SPRAY | RESPIRATORY_TRACT | Status: AC
  Filled 2023-07-21: qty 6.7

## 2023-07-21 MED ORDER — IPRATROPIUM-ALBUTEROL 0.5-2.5 (3) MG/3ML IN SOLN
3.0000 mL | Freq: Once | RESPIRATORY_TRACT | Status: AC
  Administered 2023-07-21: 3 mL via RESPIRATORY_TRACT

## 2023-07-21 MED ORDER — ACETAMINOPHEN 325 MG PO TABS
ORAL_TABLET | ORAL | Status: AC
  Filled 2023-07-21: qty 2

## 2023-07-21 MED ORDER — PROMETHAZINE-DM 6.25-15 MG/5ML PO SYRP: 5.0000 mL | ORAL_SOLUTION | Freq: Three times a day (TID) | ORAL | 0 refills | Status: AC | PRN

## 2023-07-21 NOTE — Discharge Instructions (Addendum)
Your COVID 19 results will be available in 24 hours.  Medications prescribed here in clinic today.  Use albuterol inhaler 2 puffs every 4-6 hours as needed for chest tightness, wheezing or shortness of breath.  Also sent you over Promethazine DM for cough you may take as needed up to 3 times daily.

## 2023-07-21 NOTE — ED Provider Notes (Signed)
Renaldo Fiddler    CSN: [id number] Arrival date & time: 07/21/23  0801      History   Chief Complaint Chief Complaint  Patient presents with   Generalized Body Aches   Cough    HPI Randy Silva is a 42 y.o. male.  Patient presents for evaluation of generalized body aches and cough. On arrival patient is febrile. Endorses increase work of breathing. Patient is a smoker. Current symptoms present x 5-6 days ago. No known sick contacts. Has taken Tylenol cough and cold medication without any relief of symptoms.  Past Medical History:  Diagnosis Date   Asthma     There are no problems to display for this patient.   History reviewed. No pertinent surgical history.     Home Medications    Prior to Admission medications   Medication Sig Start Date End Date Taking? Authorizing Provider  azithromycin (ZITHROMAX) 250 MG tablet Take 2 tabs PO x 1 dose, then 1 tab PO QD x 4 days 07/21/23  Yes Bing Neighbors, NP  predniSONE (DELTASONE) 20 MG tablet Take 2 tablets (40 mg total) by mouth daily with breakfast. 07/21/23  Yes Bing Neighbors, NP  promethazine-dextromethorphan (PROMETHAZINE-DM) 6.25-15 MG/5ML syrup Take 5 mLs by mouth 3 (three) times daily as needed for cough. 07/21/23  Yes Bing Neighbors, NP  acetaminophen (TYLENOL) 500 MG tablet Take 500 mg by mouth every 6 (six) hours as needed for mild pain or moderate pain.    [provider]  albuterol (VENTOLIN HFA) 108 (90 Base) MCG/ACT inhaler Inhale 1-2 puffs into the lungs every 6 (six) hours as needed for wheezing or shortness of breath. 03/26/20   Wallis Bamberg, PA-C  benzonatate (TESSALON) 100 MG capsule Take 1-2 capsules (100-200 mg total) by mouth 3 (three) times daily as needed. 03/26/20   Wallis Bamberg, PA-C  cetirizine (ZYRTEC) 10 MG tablet Take 1 tablet (10 mg total) by mouth daily. 04/10/20   Dahlia Byes A, NP  ibuprofen (ADVIL,MOTRIN) 200 MG tablet Take 200 mg by mouth every 6 (six) hours as needed  for mild pain or moderate pain.    [provider]    Family History Family History  Problem Relation Age of Onset   Cancer Mother    Cancer Father     Social History Social History   Tobacco Use   Smoking status: Every Day    Current packs/day: 1.00    Types: Cigarettes   Smokeless tobacco: Never  Vaping Use   Vaping status: Never Used  Substance Use Topics   Alcohol use: No   Drug use: Yes    Types: Marijuana     Allergies   Patient has no known allergies.   Review of Systems Review of Systems  Constitutional:  Positive for fatigue and fever.  HENT:  Positive for rhinorrhea.   Respiratory:  Positive for cough.   Musculoskeletal:  Positive for myalgias.     Physical Exam Triage Vital Signs ED Triage Vitals  Encounter Vitals Group     BP 07/21/23 0819 (!) 150/88     Systolic BP Percentile --      Diastolic BP Percentile --      Pulse Rate 07/21/23 0819 91     Resp 07/21/23 0819 18     Temp 07/21/23 0819 (!) 100.7 F (38.2 C)     Temp Source 07/21/23 0819 Oral     SpO2 07/21/23 0819 96 %     Weight 07/21/23  0819 205 lb (93 kg)     Height 07/21/23 0819 5\' 11"  (1.803 m)     Head Circumference --      Peak Flow --      Pain Score 07/21/23 0818 10     Pain Loc --      Pain Education --      Exclude from Growth Chart --    No data found.  Updated Vital Signs BP (!) 150/88 (BP Location: Left Arm)   Pulse 91   Temp (!) 100.7 F (38.2 C) (Oral)   Resp 18   Ht 5\' 11"  (1.803 m)   Wt 205 lb (93 kg)   SpO2 96%   BMI 28.59 kg/m   Visual Acuity Right Eye Distance:   Left Eye Distance:   Bilateral Distance:    Right Eye Near:   Left Eye Near:    Bilateral Near:     Physical Exam Vitals and nursing note reviewed.  Constitutional:      Appearance: He is ill-appearing.  HENT:     Head: Normocephalic and atraumatic.     Right Ear: External ear normal.     Left Ear: External ear normal.     Nose: Congestion and rhinorrhea present.      Mouth/Throat:     Pharynx: No oropharyngeal exudate or posterior oropharyngeal erythema.  Eyes:     Extraocular Movements: Extraocular movements intact.     Conjunctiva/sclera: Conjunctivae normal.     Pupils: Pupils are equal, round, and reactive to light.  Cardiovascular:     Rate and Rhythm: Normal rate and regular rhythm.  Pulmonary:     Breath sounds: Wheezing present.  Musculoskeletal:     Cervical back: Normal range of motion and neck supple.  Skin:    General: Skin is warm and dry.  Neurological:     General: No focal deficit present.     Mental Status: He is alert.      UC Treatments / Results  Labs (all labs ordered are listed, but only abnormal results are displayed) Labs Reviewed  SARS CORONAVIRUS 2 (TAT 6-24 HRS) - Abnormal; Notable for the following components:      Result Value   SARS Coronavirus 2 POSITIVE (*)    All other components within normal limits    EKG   Radiology No results found.  Procedures Procedures (including critical care time)  Medications Ordered in UC Medications  acetaminophen (TYLENOL) tablet 650 mg (650 mg Oral Given 07/21/23 0823)  ipratropium-albuterol (DUONEB) 0.5-2.5 (3) MG/3ML nebulizer solution 3 mL (3 mLs Nebulization Given 07/21/23 0844)  albuterol (VENTOLIN HFA) 108 (90 Base) MCG/ACT inhaler 2 puff (2 puffs Inhalation Given 07/21/23 0855)    Initial Impression / Assessment and Plan / UC Course  I have reviewed the triage vital signs and the nursing notes.  Pertinent labs & imaging results that were available during my care of the patient were reviewed by me and considered in my medical decision making (see chart for details).    Treating for lower respiratory infection with azithromycin and prednisone along with Promethazine DM.  Patient is actively wheezing therefore 2 puffs of albuterol was given here in clinic and patient should continue albuterol inhaler at home.  COVID-19 test pending given patient is currently  febrile.  Is outside of the window of antiviral treatment given the duration of symptoms greater than 5 days therefore continue with treatment prescribed.  Strict return precautions given if symptoms worsen or do not improve.  Patient verbalized understanding and agreement with plan. Final Clinical Impressions(s) / UC Diagnoses   Final diagnoses:  Encounter for screening for COVID-19  Lower respiratory infection (e.g., bronchitis, pneumonia, pneumonitis, pulmonitis)     Discharge Instructions      Your COVID 19 results will be available in 24 hours.  Medications prescribed here in clinic today.  Use albuterol inhaler 2 puffs every 4-6 hours as needed for chest tightness, wheezing or shortness of breath.  Also sent you over Promethazine DM for cough you may take as needed up to 3 times daily.     ED Prescriptions     Medication Sig Dispense Auth. Provider   azithromycin (ZITHROMAX) 250 MG tablet Take 2 tabs PO x 1 dose, then 1 tab PO QD x 4 days 6 tablet Bing Neighbors, NP   predniSONE (DELTASONE) 20 MG tablet Take 2 tablets (40 mg total) by mouth daily with breakfast. 10 tablet Bing Neighbors, NP   promethazine-dextromethorphan (PROMETHAZINE-DM) 6.25-15 MG/5ML syrup Take 5 mLs by mouth 3 (three) times daily as needed for cough. 180 mL Bing Neighbors, NP      PDMP not reviewed this encounter.   Bing Neighbors, NP 07/23/23 1212

## 2023-07-21 NOTE — ED Triage Notes (Signed)
Patient having body aches, cold sweats, cough that is productive, fever onset 5-6 days ago. No known sick exposure.   Patient tried tylenol cold and cough with little relief.

## 2024-01-16 ENCOUNTER — Ambulatory Visit (HOSPITAL_BASED_OUTPATIENT_CLINIC_OR_DEPARTMENT_OTHER): Payer: 59 | Admitting: Cardiology

## 2024-01-16 NOTE — Progress Notes (Incomplete)
  Cardiology Office Note:  .   Date:  01/16/2024  ID:  Randy Silva, DOB 28-Oct-1981, MRN [id number] PCP: Patient, No Pcp Per  [facility] Providers Cardiologist:  None {  History of Present Illness: .   Randy Silva is a 43 y.o. male *** who is seen as a new consult for chest pain and elevated blood pressure. He is self referred, referral dated 01/12/24. There are no recent records available.     ROS: Denies chest pain, shortness of breath at rest or with normal exertion. No PND, orthopnea, LE edema or unexpected weight gain. No syncope or palpitations. ROS otherwise negative except as noted.   Studies Reviewed: Marland Kitchen    EKG:       Physical Exam:   VS:  There were no vitals taken for this visit.   Wt Readings from Last 3 Encounters:  07/21/23 205 lb (93 kg)  04/10/20 192 lb (87.1 kg)  04/09/17 180 lb (81.6 kg)    GEN: Well nourished, well developed in no acute distress HEENT: Normal, moist mucous membranes NECK: No JVD CARDIAC: regular rhythm, normal S1 and S2, no rubs or gallops. No murmur. VASCULAR: Radial and DP pulses 2+ bilaterally. No carotid bruits RESPIRATORY:  Clear to auscultation without rales, wheezing or rhonchi  ABDOMEN: Soft, non-tender, non-distended MUSCULOSKELETAL:  Ambulates independently SKIN: Warm and dry, no edema NEUROLOGIC:  Alert and oriented x 3. No focal neuro deficits noted. PSYCHIATRIC:  Normal affect    ASSESSMENT AND PLAN: .     CV risk counseling and prevention -recommend heart healthy/Mediterranean diet, with whole grains, fruits, vegetable, fish, lean meats, nuts, and olive oil. Limit salt. -recommend moderate walking, 3-5 times/week for 30-50 minutes each session. Aim for at least 150 minutes.week. Goal should be pace of 3 miles/hours, or walking 1.5 miles in 30 minutes -recommend avoidance of tobacco products. Avoid excess alcohol. -ASCVD risk score: The ASCVD Risk score (Arnett DK, et al., 2019) failed to calculate for the  following reasons:   Cannot find a previous HDL lab   Cannot find a previous total cholesterol lab    Dispo: ***  Signed, Jodelle Red, MD   Jodelle Red, MD, PhD, [facility] [place]  [facility]  [place]  [facility] at [facility] at [facility] [address] [place], [place] [address] [phone]
